# Patient Record
Sex: Male | Born: 1960 | Race: Asian | Hispanic: No | Marital: Married | Smoking: Former smoker
Health system: Southern US, Community
[De-identification: ages and names within clinical notes are randomized; demographics above are authoritative.]

---

## 2003-02-23 ENCOUNTER — Encounter: Admission: RE | Admit: 2003-02-23 | Discharge: 2003-02-23 | Payer: Self-pay | Admitting: Family Medicine

## 2003-03-01 ENCOUNTER — Encounter: Admission: RE | Admit: 2003-03-01 | Discharge: 2003-03-01 | Payer: Self-pay | Admitting: Family Medicine

## 2004-06-27 ENCOUNTER — Encounter: Admission: RE | Admit: 2004-06-27 | Discharge: 2004-06-27 | Payer: Self-pay | Admitting: Family Medicine

## 2005-04-07 ENCOUNTER — Emergency Department (HOSPITAL_COMMUNITY): Admission: EM | Admit: 2005-04-07 | Discharge: 2005-04-07 | Payer: Self-pay | Admitting: Emergency Medicine
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
## 2011-04-20 ENCOUNTER — Ambulatory Visit: Payer: Self-pay

## 2011-04-20 DIAGNOSIS — K219 Gastro-esophageal reflux disease without esophagitis: Secondary | ICD-10-CM

## 2011-04-26 ENCOUNTER — Ambulatory Visit: Payer: Self-pay

## 2011-04-26 DIAGNOSIS — K219 Gastro-esophageal reflux disease without esophagitis: Secondary | ICD-10-CM

## 2011-08-31 ENCOUNTER — Ambulatory Visit: Payer: Self-pay | Admitting: Internal Medicine

## 2011-08-31 VITALS — BP 100/69 | HR 79 | Temp 97.6°F | Resp 16 | Ht 62.0 in | Wt 109.0 lb

## 2011-08-31 DIAGNOSIS — K219 Gastro-esophageal reflux disease without esophagitis: Secondary | ICD-10-CM

## 2011-08-31 LAB — POCT CBC
Granulocyte percent: 62 %G (ref 37–80)
HCT, POC: 49 % (ref 43.5–53.7)
Hemoglobin: 16.2 g/dL (ref 14.1–18.1)
Lymph, poc: 1.8 (ref 0.6–3.4)
MCH, POC: 31.6 pg — AB (ref 27–31.2)
MCHC: 33.1 g/dL (ref 31.8–35.4)
MCV: 95.8 fL (ref 80–97)
MID (cbc): 0.6 (ref 0–0.9)
MPV: 10 fL (ref 0–99.8)
POC Granulocyte: 3.9 (ref 2–6.9)
POC LYMPH PERCENT: 28.4 %L (ref 10–50)
POC MID %: 9.6 %M (ref 0–12)
Platelet Count, POC: 195 10*3/uL (ref 142–424)
RBC: 5.12 M/uL (ref 4.69–6.13)
RDW, POC: 12.9 %
WBC: 6.3 10*3/uL (ref 4.6–10.2)

## 2011-08-31 MED ORDER — OMEPRAZOLE 40 MG PO CPDR: 40.0000 mg | DELAYED_RELEASE_CAPSULE | Freq: Every day | ORAL | Status: AC

## 2011-08-31 NOTE — Progress Notes (Signed)
  Subjective:    Patient ID: Chase Newton, male    DOB: 11-05-1960, 51 y.o.   MRN: [id number]  HPISee last visit in  December Improved briefly but now complains once again of dyspepsia with burning after each meal No nocturnal pain bad taste in the mouth times No constipation or diarrhea No Weight loss    Review of Systems     Objective:   Physical ExamVital signs stable HEENT is clear Heart regular without murmur rubs or gallops Lungs clear Abdomen soft nontender nondistended/no organomegaly or masses        Results for orders placed in visit on 08/31/11  POCT CBC      Component Value Range   WBC 6.3  4.6 - 10.2 (K/uL)   Lymph, poc 1.8  0.6 - 3.4    POC LYMPH PERCENT 28.4  10 - 50 (%L)   MID (cbc) 0.6  0 - 0.9    POC MID % 9.6  0 - 12 (%M)   POC Granulocyte 3.9  2 - 6.9    Granulocyte percent 62.0  37 - 80 (%G)   RBC 5.12  4.69 - 6.13 (M/uL)   Hemoglobin 16.2  14.1 - 18.1 (g/dL)   HCT, POC 62.8  31.5 - 53.7 (%)   MCV 95.8  80 - 97 (fL)   MCH, POC 31.6 (*) 27 - 31.2 (pg)   MCHC 33.1  31.8 - 35.4 (g/dL)   RDW, POC 17.6     Platelet Count, POC 195  142 - 424 (K/uL)   MPV 10.0  0 - 99.8 (fL)  He would like to retest his liver functions because he has a sister with liver cancer  Assessment & Plan:  Problem #1 GERD H. Pylori IgG ordered Omeprazole 40 #30 with 2 refills 1 daily Call with lab results and plan

## 2011-09-01 ENCOUNTER — Encounter: Payer: Self-pay | Admitting: Internal Medicine

## 2011-09-01 LAB — COMPREHENSIVE METABOLIC PANEL
ALT: 22 U/L (ref 0–53)
AST: 24 U/L (ref 0–37)
Albumin: 4.6 g/dL (ref 3.5–5.2)
Alkaline Phosphatase: 68 U/L (ref 39–117)
BUN: 13 mg/dL (ref 6–23)
CO2: 26 mEq/L (ref 19–32)
Calcium: 9.3 mg/dL (ref 8.4–10.5)
Chloride: 104 mEq/L (ref 96–112)
Creat: 1.07 mg/dL (ref 0.50–1.35)
Glucose, Bld: 90 mg/dL (ref 70–99)
Potassium: 4.4 mEq/L (ref 3.5–5.3)
Sodium: 141 mEq/L (ref 135–145)
Total Bilirubin: 0.5 mg/dL (ref 0.3–1.2)
Total Protein: 7.2 g/dL (ref 6.0–8.3)

## 2011-09-02 ENCOUNTER — Encounter: Payer: Self-pay | Admitting: Internal Medicine

## 2011-09-02 LAB — HELICOBACTER PYLORI  ANTIBODY, IGM

## 2015-10-22 ENCOUNTER — Telehealth: Payer: Self-pay

## 2015-10-22 NOTE — Telephone Encounter (Signed)
Pre visit call made to patient. Left message for return call. 

## 2015-10-23 ENCOUNTER — Ambulatory Visit: Payer: Self-pay | Admitting: Family Medicine

## 2015-10-23 DIAGNOSIS — Z0289 Encounter for other administrative examinations: Secondary | ICD-10-CM

## 2015-10-28 ENCOUNTER — Encounter: Payer: Self-pay | Admitting: Family Medicine

## 2016-02-18 ENCOUNTER — Other Ambulatory Visit: Payer: Self-pay | Admitting: Family Medicine

## 2016-02-18 DIAGNOSIS — Z800 Family history of malignant neoplasm of digestive organs: Secondary | ICD-10-CM

## 2016-03-03 ENCOUNTER — Ambulatory Visit
Admission: RE | Admit: 2016-03-03 | Discharge: 2016-03-03 | Disposition: A | Discharge status: Home / Self Care | Payer: No Typology Code available for payment source | Source: Ambulatory Visit | Attending: Family Medicine | Admitting: Family Medicine

## 2016-03-03 DIAGNOSIS — Z800 Family history of malignant neoplasm of digestive organs: Secondary | ICD-10-CM

## 2018-02-21 IMAGING — US US ABDOMEN COMPLETE
1 series · 14 of 25 positions shown · non-contrast
Comparison: None.

CLINICAL DATA: 54-year-old male with a history of liver cancer

EXAM:
ABDOMEN ULTRASOUND COMPLETE

[Series 1: us abdomen complete · 0.32mm/px · 14 of 82 slices shown]
[im 1/82]
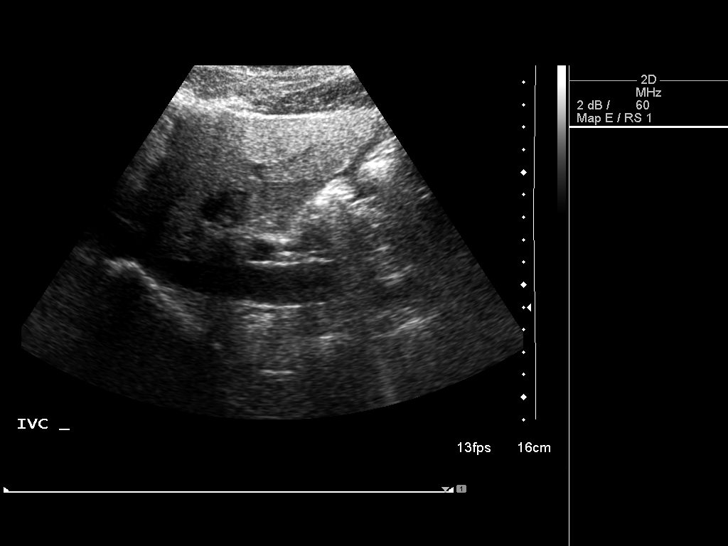
[im 7/82]
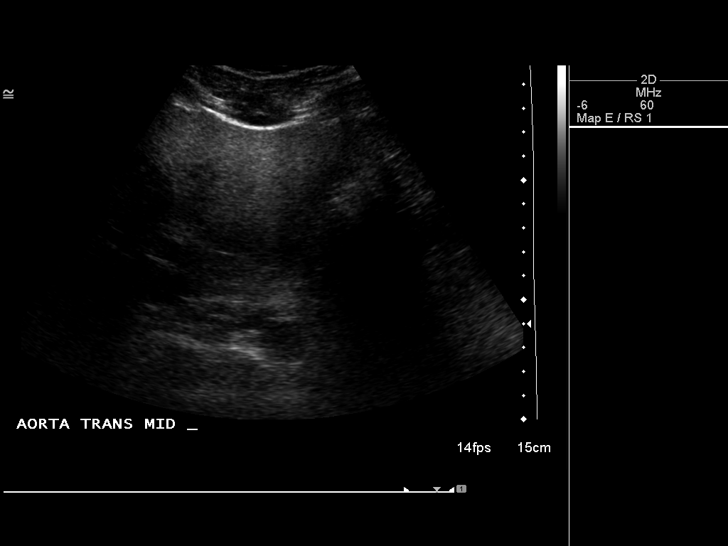
[im 14/82]
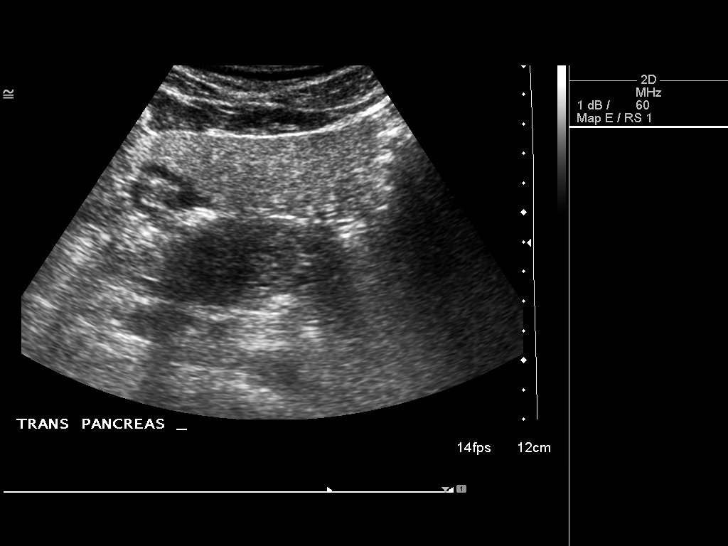
[im 21/82]
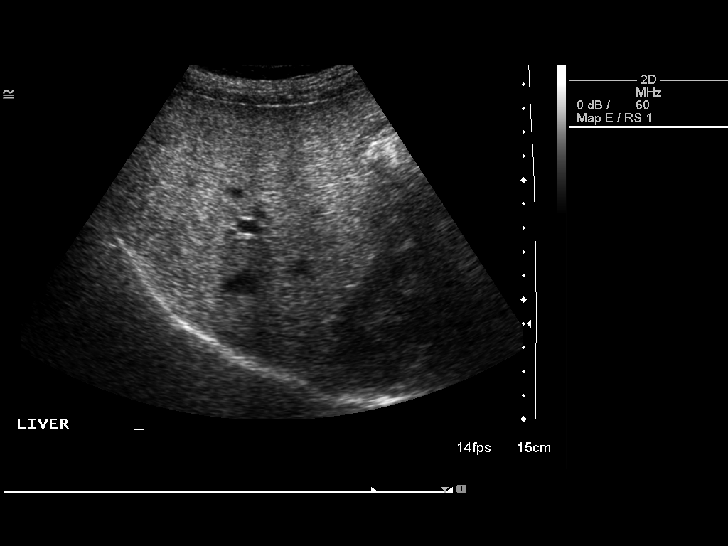
[im 28/82]
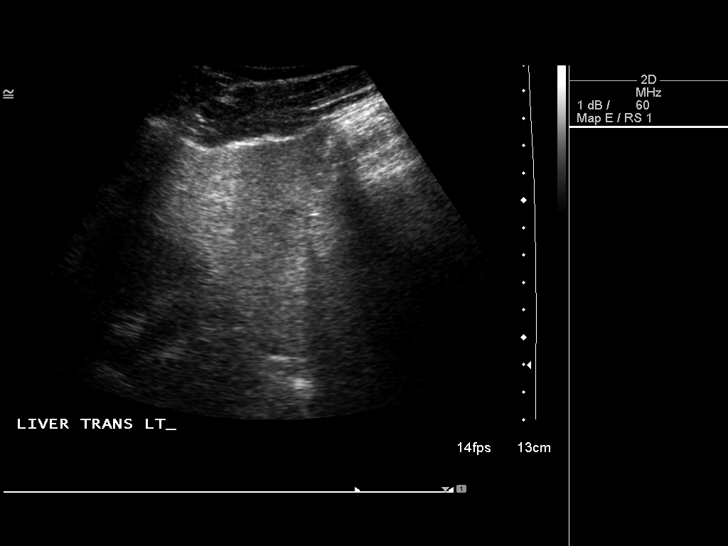
[im 31/82]
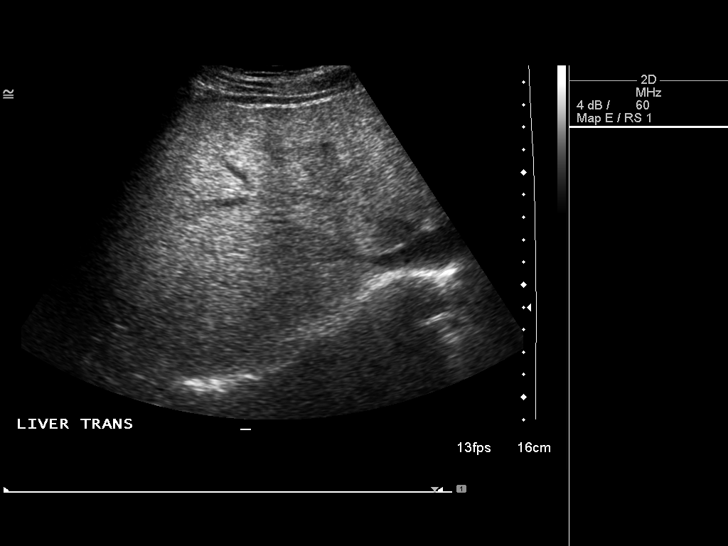
[im 38/82]
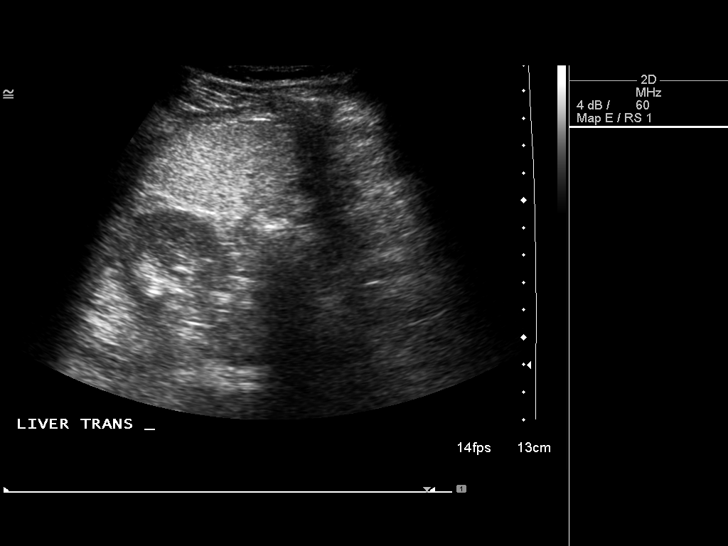
[im 44/82]
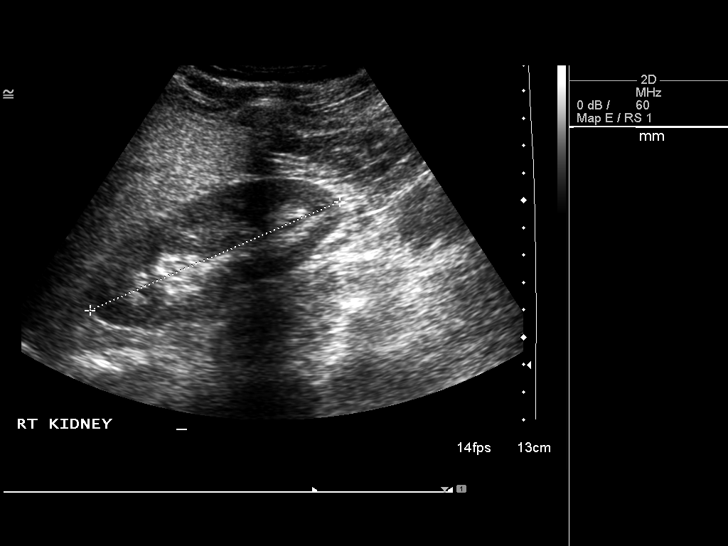
[im 51/82]
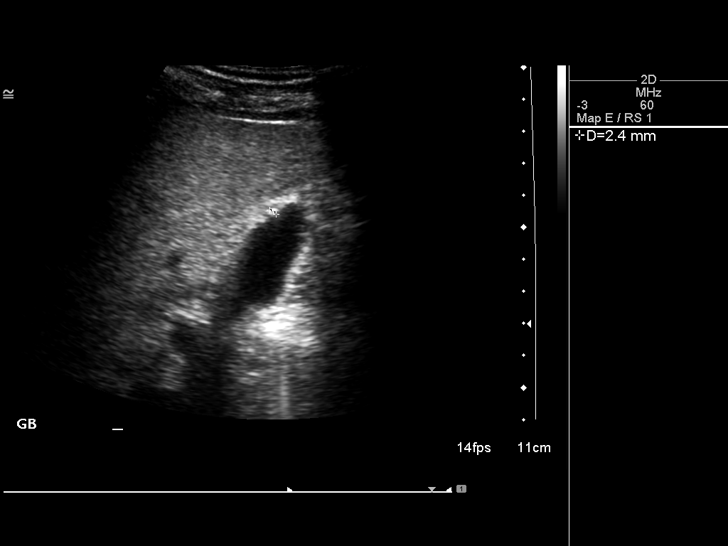
[im 55/82]
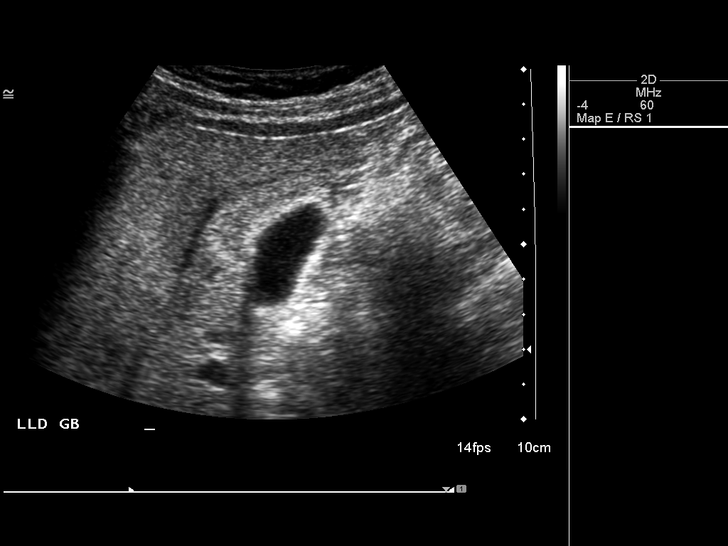
[im 61/82]
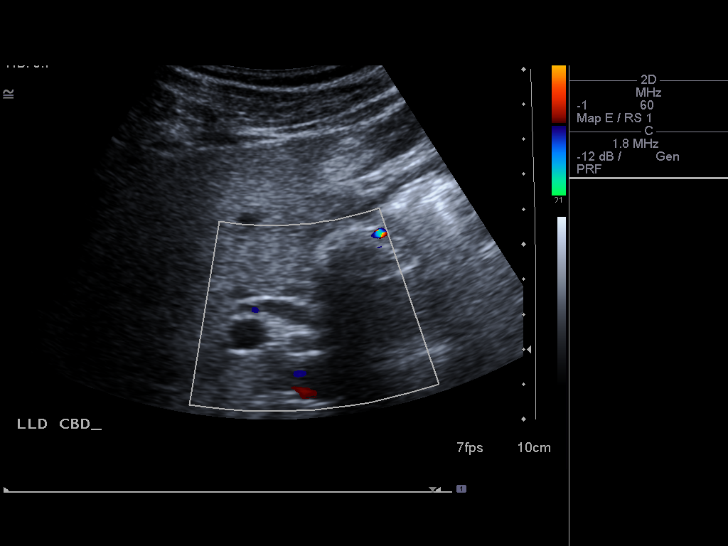
[im 68/82]
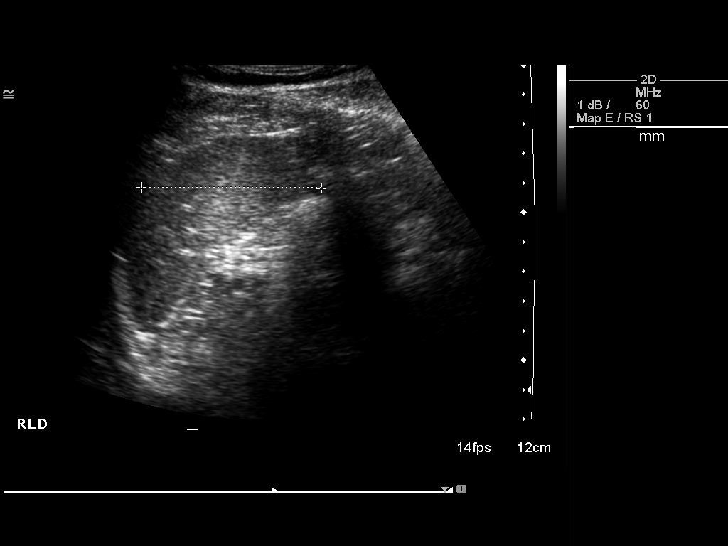
[im 75/82]
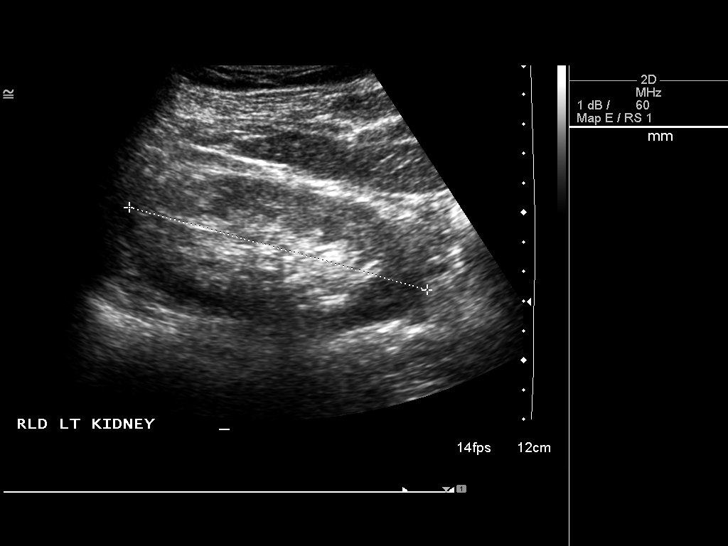
[im 82/82]
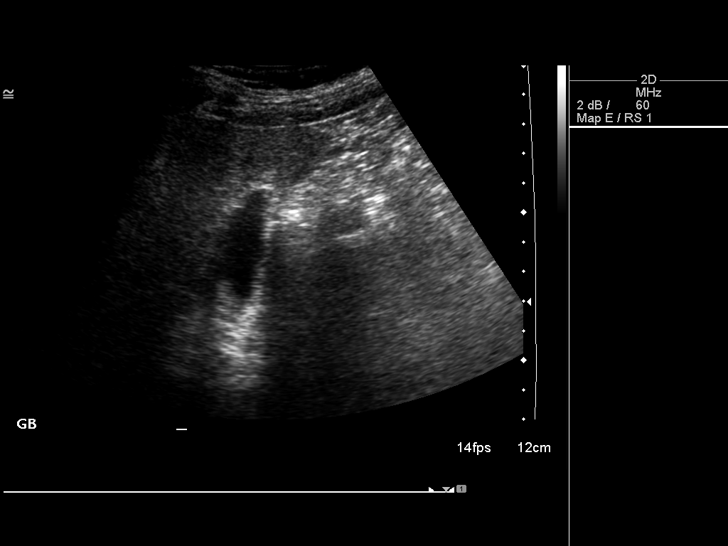

[14 of 25 positions shown; findings below may reference images not displayed]

FINDINGS: Gallbladder: No gallstones or wall thickening visualized. No
sonographic Murphy sign noted by sonographer.

Common bile duct: Diameter: 3 mm-6 mm

Liver: No focal lesion.  Heterogeneous echotexture of the liver.

IVC: No abnormality visualized.

Pancreas: Visualized portion unremarkable.

Spleen: Size and appearance within normal limits.

Right Kidney: Length: 10.2 cm. Echogenicity within normal limits. No
mass or hydronephrosis visualized.

Left Kidney: Length: 10.7 cm. Echogenicity within normal limits. No
mass or hydronephrosis visualized.

Abdominal aorta: 2.2 cm

Other findings: None.
IMPRESSION: Heterogeneous appearance of liver echotexture, suggesting steatosis
steatosis. If there is concern for further evaluation, MR or
ultrasound elastography may be considered.

## 2022-02-20 ENCOUNTER — Other Ambulatory Visit: Payer: Self-pay | Admitting: Urology

## 2022-02-20 DIAGNOSIS — R972 Elevated prostate specific antigen [PSA]: Secondary | ICD-10-CM

## 2022-08-31 ENCOUNTER — Other Ambulatory Visit: Payer: No Typology Code available for payment source

## 2022-09-03 ENCOUNTER — Ambulatory Visit: Admission: RE | Admit: 2022-09-03 | Payer: BLUE CROSS/BLUE SHIELD | Source: Ambulatory Visit
